# Patient Record
Sex: Female | Born: 2000 | Race: White | Hispanic: No | Marital: Single | Smoking: Never smoker
Health system: Southern US, Community
[De-identification: ages and names within clinical notes are randomized; demographics above are authoritative.]

## PROBLEM LIST (undated history)

## (undated) DIAGNOSIS — N390 Urinary tract infection, site not specified: Secondary | ICD-10-CM

## (undated) DIAGNOSIS — K219 Gastro-esophageal reflux disease without esophagitis: Secondary | ICD-10-CM

## (undated) DIAGNOSIS — Z973 Presence of spectacles and contact lenses: Secondary | ICD-10-CM

## (undated) DIAGNOSIS — F419 Anxiety disorder, unspecified: Secondary | ICD-10-CM

## (undated) DIAGNOSIS — F909 Attention-deficit hyperactivity disorder, unspecified type: Secondary | ICD-10-CM

## (undated) DIAGNOSIS — K5909 Other constipation: Secondary | ICD-10-CM

## (undated) DIAGNOSIS — Z87820 Personal history of traumatic brain injury: Secondary | ICD-10-CM

## (undated) DIAGNOSIS — G939 Disorder of brain, unspecified: Secondary | ICD-10-CM

## (undated) DIAGNOSIS — R531 Weakness: Secondary | ICD-10-CM

## (undated) DIAGNOSIS — Z789 Other specified health status: Secondary | ICD-10-CM

## (undated) DIAGNOSIS — F649 Gender identity disorder, unspecified: Secondary | ICD-10-CM

## (undated) DIAGNOSIS — F329 Major depressive disorder, single episode, unspecified: Secondary | ICD-10-CM

## (undated) DIAGNOSIS — N3941 Urge incontinence: Secondary | ICD-10-CM

## (undated) DIAGNOSIS — N201 Calculus of ureter: Secondary | ICD-10-CM

## (undated) DIAGNOSIS — G43909 Migraine, unspecified, not intractable, without status migrainosus: Secondary | ICD-10-CM

## (undated) DIAGNOSIS — F640 Transsexualism: Secondary | ICD-10-CM

## (undated) HISTORY — DX: Attention-deficit hyperactivity disorder, unspecified type: F90.9

## (undated) HISTORY — PX: NO PAST SURGERIES: SHX2092

---
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
## 2023-02-02 NOTE — Progress Notes (Unsigned)
[facility]  PATIENT: Chloe Berry DOB: 2001/10/03  REFERRING DOCTOR OR PCP: Windell Hummingbird, PA-C SOURCE: Notes from pediatric adult neurology ([facility], [facility], [place]), imaging and lab reports, MRI images personally reviewed.  _________________________________   HISTORICAL  CHIEF COMPLAINT:  Chief Complaint  Patient presents with   New Patient (Initial Visit)    Rm 10. Paper referral for Migraine, hx of pontine lesion    HISTORY OF PRESENT ILLNESS:  I had the pleasure seeing your patient, Chloe Berry, at the [place] at [facility] for neurologic consultation regarding her neurologic symptoms and abnormal brain MRI  She is a 22 yo who had an acute demyelinating event in July 2019 and is currently recording difficulties with chronic migraine headache.  In July 2019, she presented with vertigo and gait imbalance.  She also had right facial numbness and left arm and leg numbness.  MRI showed an enhancing lesion in the right posterior pons and MCP.   She also started to get headaches.   At the time, the differential diagnosis was broad (demyelination, glioma, ischemic) she had a lumbar puncture while in the hospital that showed oligoclonal bands not present in the serum, potentially consistent with MS.  She was transferred to [facility] where she had additional blood work including anti-MOG, anti-NMO, ANCA and these were negative.  Symptoms improved.  She was discharged and has not had any additional relapse.   She does not recall an antecedent virus or vaccination.  She had additional imaging in 2019 and 2020 and there were no new lesions.  The focus stopped enhancing and was smaller on the 2020 MRI.  This was considered most consistent with a demyelinating event.  She has oral ulcers in 2020 that may have been hand, foot and mouth disease.  There was no biopsy.  Currently, she has only mild sequela from the demyelinating event.  Specifically she just  has mild right facial numbness.  Her gait completely returned to baseline.  She never had optic neuritis or transverse myelitis.    She reports more migraines.  These are daily though some days are worse.   They last most of the day.    She gets nausea and photophobia when headaches are worse.   She denies phonophobia.      When she has a migraines she will sometime have visual changes, bilaterally.     She takes Excedrin or sumatriptan, often with benefit but then headaches comes back.  Sleeping usually helps the migrane and most of the time with severe headache, she takes Excedrin and tries to sleep.    Her mother has migraines.    Topiramate helped her migraines initially but then stopped being effective.  Propranolol had not helped.   She recalls trying a higher dose without benefit after the initial dose.   Excedrin helps more than hte sumatriptan did though both help some.         She was seeing psychiatry and was on Geodon for a while but that made her angry.   Lexapro has helped the anxiety and depression.       DATA Imaging:  MRI of the brain without contrast 05/13/2018 and with contrast 05/18/2018 shows a large T2/FLAIR hyperintense focus in the right middle cerebellar peduncle and adjacent pons.  There was some lateral peripheral enhancement noted.  The brain was otherwise normal.  MRI of the cervical and thoracic and lumbar spine 05/26/2018 was normal.  By report, MRI of the  brain 10/27/2018 and 04/14/2019 showed progressive improved appearance of the infratentorial lesion.  No enhancement.   Labs: CSF 06/07/2018 showed 10 oligoclonal bands in the CSF not present in the serum.  Protein and glucose was normal. Labs July through November 2019: ESR negative, RPR negative, hemoglobin A1c 5.4, vitamin D 36, cholesterol and LDL elevated Tonsil biopsy showed hyperplastic lymphoid tissue. 12/17/2022, lipid panel showed elevated LDL 135 and total cholesterol 176, CMP was normal. By report, NMO and  MOG Abs were negative in the past.    REVIEW OF SYSTEMS: Constitutional: No fevers, chills, sweats, or change in appetite Eyes: No visual changes, double vision, eye pain Ear, nose and throat: No hearing loss, ear pain, nasal congestion, sore throat Cardiovascular: No chest pain, palpitations Respiratory:  No shortness of breath at rest or with exertion.   No wheezes GastrointestinaI: No nausea, vomiting, diarrhea, abdominal pain, fecal incontinence Genitourinary:  No dysuria, urinary retention or frequency.  No nocturia. Musculoskeletal:  No neck pain, back pain Integumentary: No rash, pruritus, skin lesions Neurological: as above Psychiatric: According to notes had self-harm thoughts and had a short hospitalization.  Notes depression and anxiety Endocrine: No palpitations, diaphoresis, change in appetite, change in weigh or increased thirst.  M to F transitioning Hematologic/Lymphatic:  No anemia, purpura, petechiae. Allergic/Immunologic: No itchy/runny eyes, nasal congestion, recent allergic reactions, rashes  ALLERGIES: Allergies  Allergen Reactions   Oxcarbazepine Nausea And Vomiting   Propranolol Nausea And Vomiting    HOME MEDICATIONS:  Current Outpatient Medications:    amphetamine-dextroamphetamine (ADDERALL) 30 MG tablet, Take 60 mg by mouth daily., Disp: , Rfl:    escitalopram (LEXAPRO) 20 MG tablet, Take 20 mg by mouth daily., Disp: , Rfl:    estradiol (ESTRACE) 2 MG tablet, Take 6 mg by mouth See admin instructions. 2mg  in the morning and 1mg  in the evening, Disp: , Rfl: 1   ibuprofen (ADVIL,MOTRIN) 600 MG tablet, Take 1 tablet (600 mg total) by mouth every 8 (eight) hours as needed for mild pain or moderate pain., Disp: 30 tablet, Rfl: 0   spironolactone (ALDACTONE) 50 MG tablet, Take 50-100 mg by mouth See admin instructions. Take 100 mg in the morning, and 50 mg in the evening, Disp: , Rfl:    VITAMIN D PO, Take 50,000 Units by mouth once a week., Disp: , Rfl:     zonisamide (ZONEGRAN) 100 MG capsule, Take 1 capsule (100 mg total) by mouth at bedtime as needed., Disp: 30 capsule, Rfl: 11  PAST MEDICAL HISTORY: Past Medical History:  Diagnosis Date   ADHD (attention deficit hyperactivity disorder)    Anxiety    Chronic constipation    Gender dysphoria    GERD (gastroesophageal reflux disease)    occasional-- takes zantac   History of concussion    2017  and 2018  per father no residuals   Left-sided weakness    per father intermittant upper and lower extermity weakness, unsure cause but MRI was done 2019   Major depression    history [facility] admission 05/ 2019 for suicidal ideation's and hx self harm   Female-to-female transgender person    followed by PED endocrinology @ [facility]   Migraines    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--  followed by P[facility] neurology at [facility]   Right ureteral stone    Urgency incontinence    UTI (urinary tract infection) 12/31/2018   treated with antibiotics   Wears glasses  PAST SURGICAL HISTORY: Past Surgical History:  Procedure Laterality Date   CYSTOSCOPY WITH RETROGRADE PYELOGRAM, URETEROSCOPY AND STENT PLACEMENT Right 01/13/2019   Procedure: CYSTOSCOPY WITH RETROGRADE RIGHT URETEROSCOPY, STONE EXTRACTION;  Surgeon: Lucas Mallow, MD;  Location: [facility];  Service: Urology;  Laterality: Right;   IR FLUORO GUIDED NEEDLE PLC ASPIRATION/INJECTION LOC  06/10/2018    FAMILY HISTORY: Family History  Problem Relation Age of Onset   Early puberty Mother    Hyperlipidemia Mother    Migraines Mother    Depression Mother    Anxiety disorder Mother    ADD / ADHD Mother    Obesity Father    Hyperlipidemia Maternal Grandmother    Hyperlipidemia Maternal Grandfather    Heart disease Maternal Grandfather    Hyperlipidemia Paternal Grandmother    Hyperlipidemia Paternal Grandfather    Seizures Neg Hx    Bipolar disorder Neg Hx    Schizophrenia  Neg Hx    Autism Neg Hx     SOCIAL HISTORY: Social History   Socioeconomic History   Marital status: Single    Spouse name: Not on file   Number of children: 0   Years of education: 12   Highest education level: Not on file  Occupational History   Not on file  Tobacco Use   Smoking status: Never   Smokeless tobacco: Never  Vaping Use   Vaping Use: Every day  Substance and Sexual Activity   Alcohol use: No   Drug use: No   Sexual activity: Not Currently  Other Topics Concern   Not on file  Social History Narrative   Yaslyn  11th grade at [employer]; she struggles in school. She lives with parents, and brother. Edena enjoys singing, play with her dogs, and       PEDS endocrinology and neurology @ [employer]      Right handed   Caffeine use: Soda (1 soda every other week)   Coffee (very rare)   Social Determinants of Health   Financial Resource Strain: Not on file  Food Insecurity: Not on file  Transportation Needs: Not on file  Physical Activity: Not on file  Stress: Not on file  Social Connections: Not on file  Intimate Partner Violence: Not on file       PHYSICAL EXAM  Vitals:   02/03/23 1325  BP: 122/84  Pulse: 96  Weight: 209 lb 9.6 oz (95.1 kg)  Height: 5\' 5"  (1.651 m)    Body mass index is 34.88 kg/m.   General: The patient is in no acute distress  HEENT:  Head is [place]/AT.  Sclera are anicteric.  Funduscopic exam shows normal optic discs and retinal vessels.  Neck: No carotid bruits are noted.  The neck is nontender.  Cardiovascular: The heart has a regular rate and rhythm with a normal S1 and S2. There were no murmurs, gallops or rubs.    Skin: Extremities are without rash or  edema.  Musculoskeletal:  Back is nontender  Neurologic Exam  Mental status: The patient is alert and oriented x 3 at the time of the examination. The patient has apparent normal recent and remote memory, with an apparently normal attention span and concentration  ability.   Speech is normal.  Cranial nerves: Extraocular movements are full. Pupils are equal, round, and reactive to light and accomodation.  Visual fields are full.  Facial symmetry is present. There is mild reduced  facial sensation to soft touch on the right.  [name]  Facial strength is normal.  Trapezius and sternocleidomastoid strength is normal. No dysarthria is noted.  The tongue is midline, and the patient has symmetric elevation of the soft palate. No obvious hearing deficits are noted.  Motor:  Muscle bulk is normal.   Tone is normal. Strength is  5 / 5 in all 4 extremities.   Sensory: Sensory testing is intact to pinprick, soft touch and vibration sensation in all 4 extremities.  Coordination: Cerebellar testing reveals good finger-nose-finger and heel-to-shin bilaterally.  Gait and station: Station is normal.   Gait is normal. Tandem gait is normal. Romberg is negative.   Reflexes: Deep tendon reflexes are symmetric and normal bilaterally.   Plantar responses are flexor.    DIAGNOSTIC DATA (LABS, IMAGING, TESTING) - I reviewed patient records, labs, notes, testing and imaging myself where available.  Lab Results  Component Value Date   WBC 17.2 (H) 12/31/2018   HGB 13.0 12/31/2018   HCT 39.2 12/31/2018   MCV 85.8 12/31/2018   PLT 411 (H) 12/31/2018      Component Value Date/Time   NA 137 12/31/2018 2204   K 4.1 12/31/2018 2204   CL 104 12/31/2018 2204   CO2 23 12/31/2018 2204   GLUCOSE 113 (H) 12/31/2018 2204   BUN 7 12/31/2018 2204   CREATININE 0.92 12/31/2018 2204   CREATININE 0.49 09/20/2012 0743   CALCIUM 9.2 12/31/2018 2204   PROT 6.6 12/31/2018 2204   ALBUMIN 3.8 12/31/2018 2204   AST 27 12/31/2018 2204   ALT 12 12/31/2018 2204   ALKPHOS 62 12/31/2018 2204   BILITOT 0.3 12/31/2018 2204   GFRNONAA NOT CALCULATED 12/31/2018 2204   GFRAA NOT CALCULATED 12/31/2018 2204   Lab Results  Component Value Date   CHOL 181 (H) 03/05/2018   HDL 40 (L) 03/05/2018    LDLCALC 128 (H) 03/05/2018   TRIG 64 03/05/2018   CHOLHDL 4.5 03/05/2018   Lab Results  Component Value Date   HGBA1C 5.7 (H) 03/05/2018   No results found for: "VITAMINB12" Lab Results  Component Value Date   TSH 3.042 03/05/2018       ASSESSMENT AND PLAN  Demyelinating changes in brain - Plan: Anti-MOG, Serum, MR BRAIN W WO CONTRAST  Chronic migraine w/o aura, not intractable, w/o stat migr  Right facial numbness - Plan: MR BRAIN W WO CONTRAST   In summary, Kimie Orne is a 22 year old who had a single neurologic event in July 2019 associated with a large enhancing lesion in the right pons and middle cerebellar peduncle.  The appearance of the lesion improved with time.  Etiology is unclear.  A single demyelinating event could be postviral or part of a potentially relapsing syndrome such as MS or MOGAD.  I will recheck the anti-MOG antibody as testing technique has improved compared to a few years ago.  Additionally we need to check an MRI of the brain to determine if there has been silent progression.  If this is occurring, MS becomes more likely.  If she has no progression over 5 years, then the likelihood of MS would be in the low single digits.  A second problem is chronic headache.  Many of the headaches have migrainous features 6.  I gave her some samples of Ubrelvy 100 mg Lot [id number]   Exp 07/2024.  Additionally we will start zonisamide 100 mg nightly.  This dose can be adjusted.  Based on response.  We discussed that if she gets no benefit from the zonisamide I would  consider one of the anti-CGRP injections.  She will return to see me in 4 months or sooner if there are new or worsening neurologic symptoms.  Thank you for asking me to see him this patient.  Please let me know what can be of further assistance with her or other patients in the future.  Ajahni Nay A. Felecia Shelling, MD, [facility] [id number], [id number] PM Certified in Neurology, Clinical Neurophysiology, Sleep Medicine and  Neuroimaging  [facility] [address], [place], [place] [address] [phone]

## 2023-02-03 ENCOUNTER — Encounter: Payer: Self-pay | Admitting: Neurology

## 2023-02-03 ENCOUNTER — Ambulatory Visit (INDEPENDENT_AMBULATORY_CARE_PROVIDER_SITE_OTHER): Payer: 59 | Admitting: Neurology

## 2023-02-03 VITALS — BP 122/84 | HR 96 | Ht 65.0 in | Wt 209.6 lb

## 2023-02-03 DIAGNOSIS — G379 Demyelinating disease of central nervous system, unspecified: Secondary | ICD-10-CM

## 2023-02-03 DIAGNOSIS — G43709 Chronic migraine without aura, not intractable, without status migrainosus: Secondary | ICD-10-CM

## 2023-02-03 DIAGNOSIS — R200 Anesthesia of skin: Secondary | ICD-10-CM | POA: Diagnosis not present

## 2023-02-03 MED ORDER — ZONISAMIDE 100 MG PO CAPS: 100.0000 mg | ORAL_CAPSULE | Freq: Every evening | ORAL | 11 refills | Status: AC | PRN

## 2023-02-04 LAB — ANTI-MOG, SERUM: MOG Antibody, Cell-based IFA: NEGATIVE

## 2023-02-10 ENCOUNTER — Ambulatory Visit (INDEPENDENT_AMBULATORY_CARE_PROVIDER_SITE_OTHER): Payer: 59

## 2023-02-10 DIAGNOSIS — G379 Demyelinating disease of central nervous system, unspecified: Secondary | ICD-10-CM

## 2023-02-10 DIAGNOSIS — R200 Anesthesia of skin: Secondary | ICD-10-CM

## 2023-02-10 MED ORDER — GADOBENATE DIMEGLUMINE 529 MG/ML IV SOLN
20.0000 mL | Freq: Once | INTRAVENOUS | Status: AC | PRN
Start: 2023-02-10 — End: 2023-02-10
  Administered 2023-02-10: 20 mL via INTRAVENOUS

## 2023-02-13 ENCOUNTER — Encounter: Payer: Self-pay | Admitting: Neurology

## 2023-06-23 ENCOUNTER — Ambulatory Visit: Payer: 59 | Admitting: Neurology

## 2023-06-23 ENCOUNTER — Encounter: Payer: Self-pay | Admitting: Neurology
